# Patient Record
Sex: Female | Born: 2000 | Race: Black or African American | Hispanic: No | Marital: Single | Smoking: Never smoker
Health system: Southern US, Community
[De-identification: ages and names within clinical notes are randomized; demographics above are authoritative.]

---

## 2020-07-07 ENCOUNTER — Other Ambulatory Visit: Payer: Self-pay | Admitting: Nurse Practitioner

## 2020-07-07 DIAGNOSIS — N63 Unspecified lump in unspecified breast: Secondary | ICD-10-CM

## 2020-08-06 ENCOUNTER — Ambulatory Visit
Admission: RE | Admit: 2020-08-06 | Discharge: 2020-08-06 | Disposition: A | Discharge status: Home / Self Care | Payer: PRIVATE HEALTH INSURANCE | Source: Ambulatory Visit | Attending: Nurse Practitioner | Admitting: Nurse Practitioner

## 2020-08-06 ENCOUNTER — Other Ambulatory Visit: Payer: Self-pay

## 2020-08-06 ENCOUNTER — Other Ambulatory Visit: Payer: Self-pay | Admitting: Nurse Practitioner

## 2020-08-06 DIAGNOSIS — N63 Unspecified lump in unspecified breast: Secondary | ICD-10-CM

## 2020-08-11 ENCOUNTER — Other Ambulatory Visit: Payer: Self-pay

## 2020-08-20 ENCOUNTER — Other Ambulatory Visit: Payer: Self-pay

## 2021-02-17 ENCOUNTER — Other Ambulatory Visit: Payer: Self-pay | Admitting: Surgery

## 2021-02-18 ENCOUNTER — Other Ambulatory Visit: Payer: PRIVATE HEALTH INSURANCE

## 2021-03-11 ENCOUNTER — Other Ambulatory Visit: Payer: Self-pay

## 2021-03-11 ENCOUNTER — Ambulatory Visit
Admission: RE | Admit: 2021-03-11 | Discharge: 2021-03-11 | Disposition: A | Discharge status: Home / Self Care | Payer: No Typology Code available for payment source | Source: Ambulatory Visit | Attending: Nurse Practitioner | Admitting: Nurse Practitioner

## 2021-03-11 ENCOUNTER — Other Ambulatory Visit: Payer: Self-pay | Admitting: Nurse Practitioner

## 2021-03-11 DIAGNOSIS — N63 Unspecified lump in unspecified breast: Secondary | ICD-10-CM

## 2021-03-12 ENCOUNTER — Other Ambulatory Visit: Payer: Self-pay

## 2021-03-12 ENCOUNTER — Emergency Department (HOSPITAL_COMMUNITY)
Admission: EM | Admit: 2021-03-12 | Discharge: 2021-03-12 | Disposition: A | Discharge status: Home / Self Care | Payer: No Typology Code available for payment source | Attending: Emergency Medicine | Admitting: Emergency Medicine

## 2021-03-12 ENCOUNTER — Encounter (HOSPITAL_COMMUNITY): Payer: Self-pay | Admitting: Emergency Medicine

## 2021-03-12 DIAGNOSIS — R55 Syncope and collapse: Secondary | ICD-10-CM | POA: Diagnosis present

## 2021-03-12 DIAGNOSIS — R519 Headache, unspecified: Secondary | ICD-10-CM | POA: Diagnosis not present

## 2021-03-12 DIAGNOSIS — W01198A Fall on same level from slipping, tripping and stumbling with subsequent striking against other object, initial encounter: Secondary | ICD-10-CM | POA: Insufficient documentation

## 2021-03-12 NOTE — ED Provider Notes (Signed)
[facility]-EMERGENCY DEPT Provider Note   CSN: [id number] Arrival date & time: 03/12/21  1900     History Chief Complaint  Patient presents with   Lisa Buck is a 20 y.o. female.  20 year old otherwise healthy female presents with report of syncopal episode which occurred yesterday.  Patient states that she was at a family gathering, was wearing multiple layers and began to feel very hot.  Patient stood up to go outside and at that time passed out and hit the floor.  Reports brief loss of consciousness, awoke to family standing around her.  States that she has a mild headache today.  She denies nausea, vomiting, visual disturbance.  No history of prior syncopal episodes.  No loss of bowel or bladder control with her episode.  Patient is not anticoagulated.  No other complaints or concerns.     OB History   No obstetric history on file.     No family history on file.  Social History   Tobacco Use   Smoking status: Never   Smokeless tobacco: Never  Substance Use Topics   Alcohol use: Yes   Drug use: Not Currently    Home Medications Prior to Admission medications   Not on File    Allergies    Patient has no known allergies.  Review of Systems   Review of Systems  Constitutional:  Negative for chills and fever.  Eyes:  Negative for visual disturbance.  Gastrointestinal:  Negative for nausea and vomiting.  Musculoskeletal:  Negative for arthralgias, back pain, gait problem, myalgias, neck pain and neck stiffness.  Skin:  Negative for rash and wound.  Allergic/Immunologic: Negative for immunocompromised state.  Neurological:  Positive for syncope and headaches. Negative for seizures.  Hematological:  Does not bruise/bleed easily.  Psychiatric/Behavioral:  Negative for confusion.   All other systems reviewed and are negative.  Physical Exam Updated Vital Signs BP (!) 141/83   Pulse 66   Temp 98.7 F (37.1 C) (Oral)   Resp 20    Ht 5\' 1"  (1.549 m)   Wt 58.1 kg   LMP 02/20/2021   SpO2 98%   BMI 24.19 kg/m   Physical Exam Vitals and nursing note reviewed.  Constitutional:      General: She is not in acute distress.    Appearance: She is well-developed. She is not diaphoretic.  HENT:     Head: Normocephalic and atraumatic.     Right Ear: Tympanic membrane and ear canal normal.     Left Ear: Tympanic membrane and ear canal normal.     Nose: Nose normal.     Mouth/Throat:     Mouth: Mucous membranes are moist.  Eyes:     Extraocular Movements: Extraocular movements intact.     Pupils: Pupils are equal, round, and reactive to light.  Cardiovascular:     Rate and Rhythm: Normal rate and regular rhythm.     Heart sounds: Normal heart sounds.  Pulmonary:     Effort: Pulmonary effort is normal.     Breath sounds: Normal breath sounds.  Musculoskeletal:     Cervical back: Normal range of motion and neck supple. No tenderness or bony tenderness.     Thoracic back: No tenderness or bony tenderness.     Lumbar back: No tenderness or bony tenderness.     Right lower leg: No edema.     Left lower leg: No edema.  Skin:    General: Skin  is warm and dry.     Findings: No erythema or rash.  Neurological:     General: No focal deficit present.     Mental Status: She is alert and oriented to person, place, and time.     Cranial Nerves: No cranial nerve deficit.     Sensory: No sensory deficit.     Motor: No weakness.     Coordination: Coordination normal.  Psychiatric:        Behavior: Behavior normal.    ED Results / Procedures / Treatments   Labs (all labs ordered are listed, but only abnormal results are displayed) Labs Reviewed - No data to display  EKG None  Radiology US BREAST LTD UNI RIGHT INC AXILLA  Result Date: 03/11/2021 CLINICAL DATA:  21 year old female presenting for first six-month follow-up of a probably benign right breast mass. EXAM: ULTRASOUND OF THE RIGHT BREAST COMPARISON:   Previous exam(s). FINDINGS: Targeted ultrasound is performed, showing stable appearance of a circumscribed hypoechoic mass at the 7:30 position 5 cm from the nipple. It measures 1.1 x 0.4 x 0.8 cm (previously 1.2 x 0.5 x 0.8 cm). IMPRESSION: Stable, probably benign right breast mass. Recommend continued short-term follow-up. RECOMMENDATION: Right breast ultrasound in 6 months. I have discussed the findings and recommendations with the patient. If applicable, a reminder letter will be sent to the patient regarding the next appointment. BI-RADS CATEGORY  3: Probably benign. Electronically Signed   By: Kristopher Oppenheim M.D.   On: 03/11/2021 10:55   Procedures Procedures   Medications Ordered in ED Medications - No data to display  ED Course  I have reviewed the triage vital signs and the nursing notes.  Pertinent labs & imaging results that were available during my care of the patient were reviewed by me and considered in my medical decision making (see chart for details).  Clinical Course as of 03/12/21 2154  Sat Mar 13, 5419  1651 20 year old otherwise healthy female with report of syncopal episode which occurred yesterday.  Patient reports brain multiple layers of clothing, feeling overheated and when she stood up to leave the room to get air she passed out. Reports mild headache at this time otherwise no history of syncope otherwise, not anticoagulated, no vomiting.  No other injuries, complaints, concerns. Exam is unremarkable, there is no head tenderness, no midline neck or back tenderness, neuro exam normal, gait normal. Suspect syncopal episode likely due to overheating.  Patient is concerned she may have a concussion, she is referred to concussion clinic for follow-up, advised return to the emergency room for any worsening or concerning symptoms. [LM]    Clinical Course User Index [LM] Lisa Buck   MDM Rules/Calculators/A&P                           Final Clinical  Impression(s) / ED Diagnoses Final diagnoses:  Syncope, unspecified syncope type    Rx / DC Orders ED Discharge Orders     None        Lisa Buck 03/12/21 2154    Varney Biles, MD 03/13/21 1349

## 2021-03-12 NOTE — ED Triage Notes (Signed)
Patient arrives stating that she was at a "house gathering" last night playing board games when she got over heated due to how she was dressed. When patient stood up, she states she possibly fainted. Patient unsure if she hit her head, but does endorse a headache. Patient endorses some alcohol use, no drug use during the event.

## 2021-03-12 NOTE — Discharge Instructions (Signed)
You can take Motrin and Tylenol as needed as directed for headaches.  Follow-up with the concussion clinic if needed, call Monday to schedule an appointment.  Return to the emergency room for worsening or concerning symptoms.

## 2021-06-10 ENCOUNTER — Other Ambulatory Visit: Payer: Self-pay

## 2021-06-10 ENCOUNTER — Emergency Department (HOSPITAL_COMMUNITY)
Admission: EM | Admit: 2021-06-10 | Discharge: 2021-06-11 | Disposition: A | Discharge status: Home / Self Care | Payer: No Typology Code available for payment source | Attending: Emergency Medicine | Admitting: Emergency Medicine

## 2021-06-10 ENCOUNTER — Encounter (HOSPITAL_COMMUNITY): Payer: Self-pay | Admitting: Emergency Medicine

## 2021-06-10 DIAGNOSIS — N898 Other specified noninflammatory disorders of vagina: Secondary | ICD-10-CM | POA: Insufficient documentation

## 2021-06-10 DIAGNOSIS — R102 Pelvic and perineal pain: Secondary | ICD-10-CM | POA: Insufficient documentation

## 2021-06-10 DIAGNOSIS — B081 Molluscum contagiosum: Secondary | ICD-10-CM | POA: Insufficient documentation

## 2021-06-10 DIAGNOSIS — R21 Rash and other nonspecific skin eruption: Secondary | ICD-10-CM | POA: Diagnosis present

## 2021-06-10 LAB — URINALYSIS, MICROSCOPIC (REFLEX)

## 2021-06-10 LAB — URINALYSIS, ROUTINE W REFLEX MICROSCOPIC
Bilirubin Urine: NEGATIVE
Glucose, UA: NEGATIVE mg/dL
Hgb urine dipstick: NEGATIVE
Ketones, ur: 15 mg/dL — AB
Nitrite: NEGATIVE
Protein, ur: NEGATIVE mg/dL
Specific Gravity, Urine: 1.02 (ref 1.005–1.030)
pH: 6 (ref 5.0–8.0)

## 2021-06-10 NOTE — ED Provider Triage Note (Signed)
Emergency Medicine Provider Triage Evaluation Note  Lisa Buck , a 21 y.o. female  was evaluated in triage.  Pt complains of vaginal pain/discharge x2 weeks.  She was evaluated [facility] her [facility] at the start of her symptoms and diagnosed with BV, she was treated with Flagyl gel.  She was evaluated at student [facility] center again on 06/07/2021 for similar symptoms and at that time tested positive for BV again and was treated with Flagyl p.o. prescription.  She has associated rash to the vagina that is painful to the touch.  She is sexually active with intermittent protected intercourse.  Has not tried any medication for symptoms.  Denies fever, chills, abdominal pain, nausea, vomiting.  Review of Systems  Positive: As per HPI above Negative: As per HPI above  Physical Exam  BP 137/76    Pulse 88    Temp 98.5 F (36.9 C) (Oral)    Resp 16    Ht 5\' 1"  (1.549 m)    Wt 64 kg    SpO2 98%    BMI 26.66 kg/m  Gen:   Awake, no distress   Resp:  Normal effort  MSK:   Moves extremities without difficulty  Other:  No abdominal tenderness to palpation.  GU exam deferred in triage.  Medical Decision Making  Medically screening exam initiated at 10:17 PM.  Appropriate orders placed.  ADELAIDE PFEFFERKORN was informed that the remainder of the evaluation will be completed by another provider, this initial triage assessment does not replace that evaluation, and the importance of remaining in the ED until their evaluation is complete.    Timotheus Salm A, PA-C 06/10/21 2217

## 2021-06-10 NOTE — ED Triage Notes (Signed)
Patient reports vaginal pain with discharge and rashes onset 2 weeks ago , denies dysuria or hematuria .

## 2021-06-11 LAB — WET PREP, GENITAL
Sperm: NONE SEEN
Trich, Wet Prep: NONE SEEN
WBC, Wet Prep HPF POC: >=10 — AB (ref <10)
Yeast Wet Prep HPF POC: NONE SEEN

## 2021-06-11 LAB — HIV ANTIBODY (ROUTINE TESTING W REFLEX): HIV Screen 4th Generation wRfx: NONREACTIVE

## 2021-06-11 LAB — RPR: RPR Ser Ql: NONREACTIVE

## 2021-06-11 NOTE — Discharge Instructions (Addendum)
Your symptoms are consistent with molluscum which is considered a sexually transmitted infection in adulthood. This is self limiting and often results without treatment, though it should be monitored by a primary care doctor or OBGYN. Use a condom when sexually active. Your additional STD test results should be availabel in MyChart in 48 hours. Finish out your course of Flagyl as previously prescribed.

## 2021-06-11 NOTE — ED Provider Notes (Signed)
Lisa Buck Provider Note   CSN: [id number] Arrival date & time: 06/10/21  2140     History  Chief Complaint  Patient presents with   Vaginal Pain /Discharge    Lisa Buck is a 21 y.o. female.  21 year old female presents to the emergency Buck for evaluation of rash to her external genitalia.  States that she has had symptoms for approximately 1 to 2 weeks.  She was previously seen and evaluated at her student health center and treated for bacterial vaginosis.  Was given a prescription for MetroGel, subsequently treated after a second visit with oral Flagyl.  Reports that her symptoms continue to persist despite this medication.  She has had no vaginal bleeding, new/worsening vaginal discharge, fever, chills, abdominal pain, nausea, vomiting, urinary symptoms.  She reports sexual activity with 1 female partner with inconsistent use of condoms.  The history is provided by the patient. No language interpreter was used.      Home Medications Prior to Admission medications   Not on File      Allergies    Patient has no known allergies.    Review of Systems   Review of Systems Ten systems reviewed and are negative for acute change, except as noted in the HPI.    Physical Exam Updated Vital Signs BP 130/70    Pulse 74    Temp 98.6 F (37 C) (Oral)    Resp 16    Ht 5\' 1"  (1.549 m)    Wt 64 kg    SpO2 98%    BMI 26.66 kg/m   Physical Exam Vitals and nursing note reviewed.  Constitutional:      General: She is not in acute distress.    Appearance: She is well-developed. She is not diaphoretic.     Comments: Nontoxic appearing and in NAD  HENT:     Head: Normocephalic and atraumatic.  Eyes:     General: No scleral icterus.    Conjunctiva/sclera: Conjunctivae normal.  Pulmonary:     Effort: Pulmonary effort is normal. No respiratory distress.     Comments: Respirations even and unlabored Genitourinary:    Comments: Scattered  punctate papules to the vulva, specifically the labia majora. No vesicles, ulcerations. Scant thick, white mucousy discharge in the vaginal vault. No bleeding. No CMT or adnexal TTP. Musculoskeletal:        General: Normal range of motion.     Cervical back: Normal range of motion.  Skin:    General: Skin is warm and dry.     Coloration: Skin is not pale.     Findings: No erythema or rash.  Neurological:     Mental Status: She is alert and oriented to person, place, and time.     Coordination: Coordination normal.  Psychiatric:        Behavior: Behavior normal.    ED Results / Procedures / Treatments   Labs (all labs ordered are listed, but only abnormal results are displayed) Labs Reviewed  WET PREP, GENITAL - Abnormal; Notable for the following components:      Result Value   Clue Cells Wet Prep HPF POC PRESENT (*)    WBC, Wet Prep HPF POC >=10 (*)    All other components within normal limits  URINALYSIS, ROUTINE W REFLEX MICROSCOPIC - Abnormal; Notable for the following components:   Ketones, ur 15 (*)    Leukocytes,Ua TRACE (*)    All other components within normal limits  URINALYSIS,  MICROSCOPIC (REFLEX) - Abnormal; Notable for the following components:   Bacteria, UA RARE (*)    All other components within normal limits  HIV ANTIBODY (ROUTINE TESTING W REFLEX)  RPR  HSV 2 ANTIBODY, IGG  HSV 1 ANTIBODY, IGG  GC/CHLAMYDIA PROBE AMP ([place]) NOT AT [facility]    EKG None  Radiology No results found.  Procedures Procedures    Medications Ordered in ED Medications - No data to display  ED Course/ Medical Decision Making/ A&P                           Medical Decision Making Amount and/or Complexity of Data Reviewed Labs: ordered.   This patient presents to the ED for concern of vaginal rash/irritation, this involves Buck extensive number of treatment options, and is a complaint that carries with it a high risk of complications and morbidity.  The differential  diagnosis includes vaginitis vs STI vs genital abscess vs genital trauma.   Co morbidities that complicate the patient evaluation  None    Lab Tests:  I Ordered, and personally interpreted labs.  The pertinent results include:  negative UA; wet prep with clue cells; negative HIV screening. GC/Chlamydia and HSV testing pending   Cardiac Monitoring:  The patient was maintained on a cardiac monitor.  I personally viewed and interpreted the cardiac monitored which showed Buck underlying rhythm of: NSR   Test Considered:  Genital skin viral culture   Reevaluation:  After the interventions noted above, I reevaluated the patient and found that they have :stayed the same   Social Determinants of Health:  Insured  Educated; Archivist   Dispostion:  After consideration of the diagnostic results and the patients response to treatment, I feel that the patent would benefit from outpatient OBGYN follow up.  Physical exam findings consistent with molluscum contagiosum.  Lesions not consistent with genital herpes, syphilis.  No evidence of external genital trauma or vaginal injury.  While patient does have clue cells on her wet prep, she denies any new or worsening vaginal discharge.  Clinically do not feel that this warrants any treatment escalation given that she is currently taking oral Flagyl.  Discussed self-limiting nature of molluscum as well as return precautions.  Patient discharged in stable condition with no unaddressed concerns.         Final Clinical Impression(s) / ED Diagnoses Final diagnoses:  Molluscum contagiosum    Rx / DC Orders ED Discharge Orders     None         Antony Madura, PA-C 07/08/21 0509    Dione Booze, MD 07/17/21 2236

## 2021-06-11 NOTE — ED Notes (Signed)
Spoke with lab, states they should have enough blood for send out hsv antibody

## 2021-06-12 LAB — HSV 2 ANTIBODY, IGG: HSV 2 Glycoprotein G Ab, IgG: <0.91 index (ref 0.00–0.90)

## 2021-06-12 LAB — HSV 1 ANTIBODY, IGG: HSV 1 Glycoprotein G Ab, IgG: <0.91 index (ref 0.00–0.90)

## 2021-06-13 LAB — GC/CHLAMYDIA PROBE AMP (~~LOC~~) NOT AT ARMC
Chlamydia: NEGATIVE
Comment: NEGATIVE
Comment: NORMAL
Neisseria Gonorrhea: NEGATIVE

## 2021-09-09 ENCOUNTER — Inpatient Hospital Stay: Admission: RE | Admit: 2021-09-09 | Payer: No Typology Code available for payment source | Source: Ambulatory Visit

## 2023-06-01 IMAGING — US US BREAST*R* LIMITED INC AXILLA
1 series · 4 of 4 positions shown · non-contrast
Comparison: Previous exam(s).

CLINICAL DATA: 20-year-old female presenting for first six-month
follow-up of a probably benign right breast mass.

EXAM:
ULTRASOUND OF THE RIGHT BREAST

[Series 1: us breast*right* limited inc axilla · 0.05mm/px · 4 of 4 slices shown]
[im 1/4]
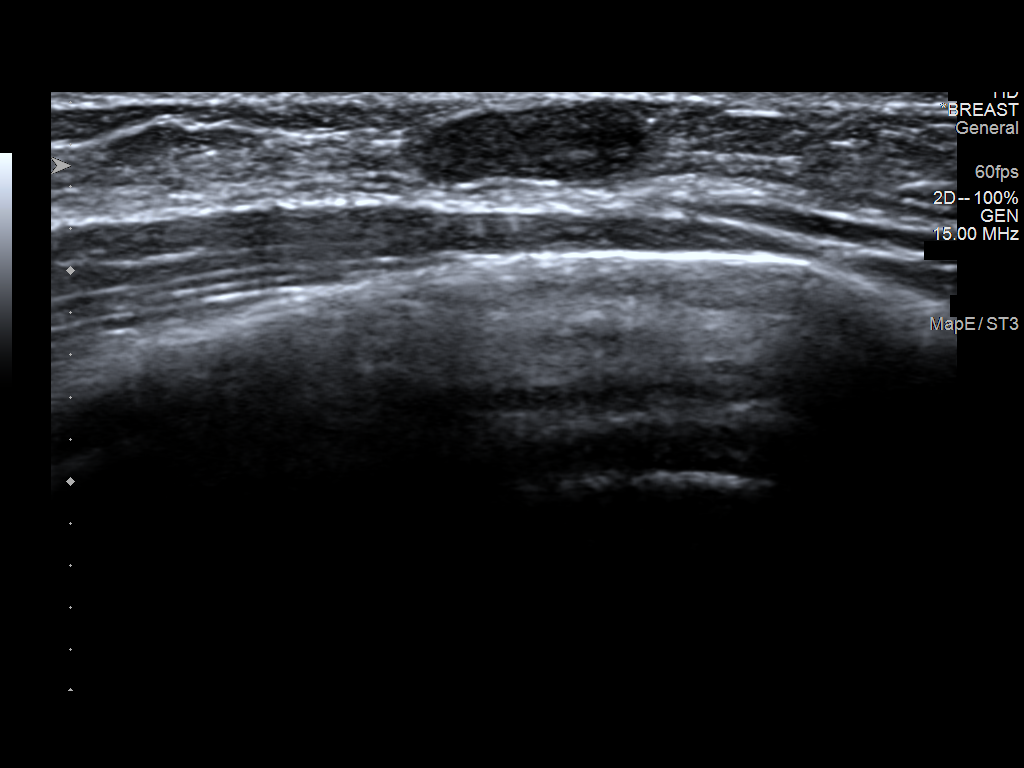
[im 2/4]
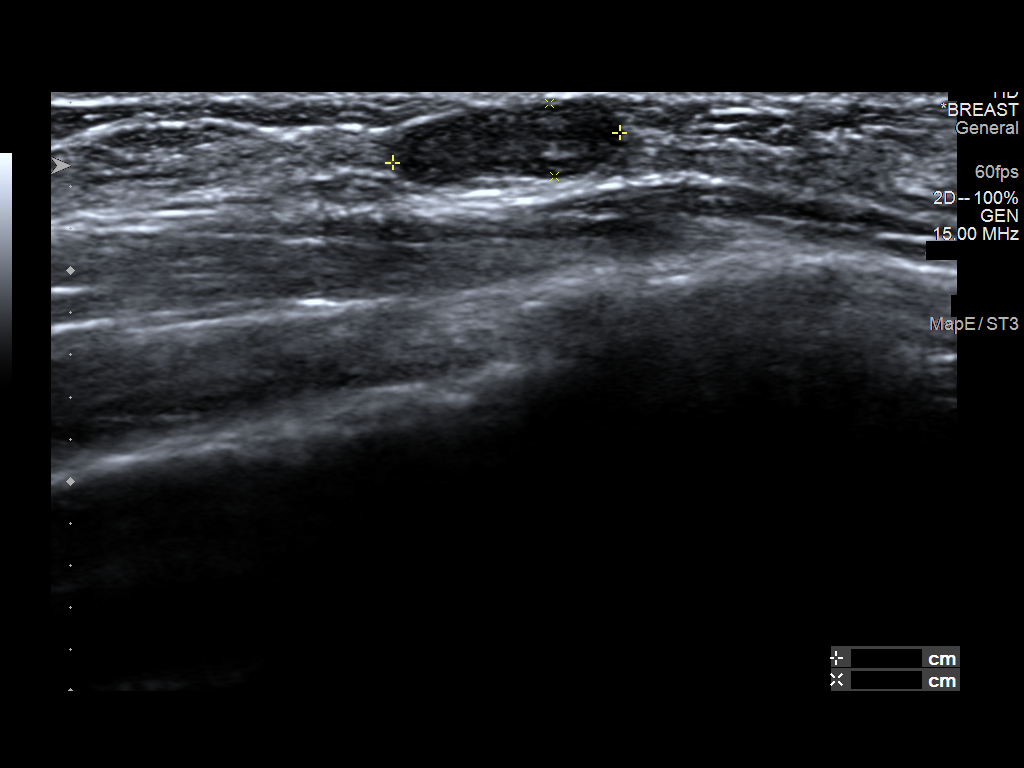
[im 3/4]
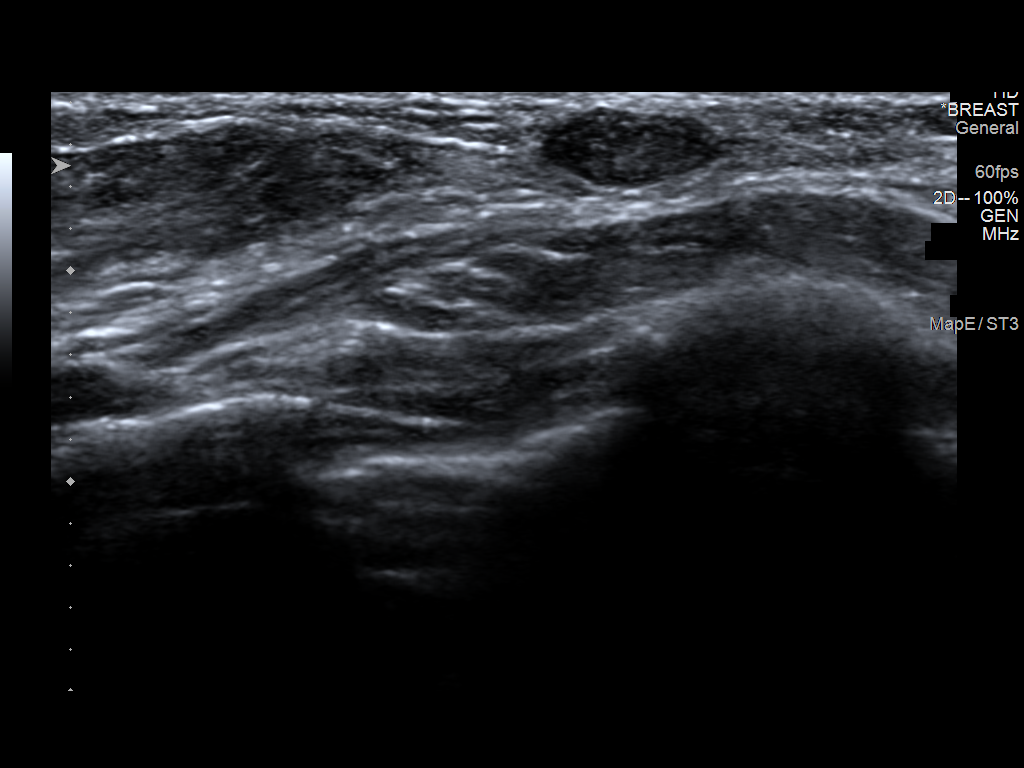
[im 4/4]
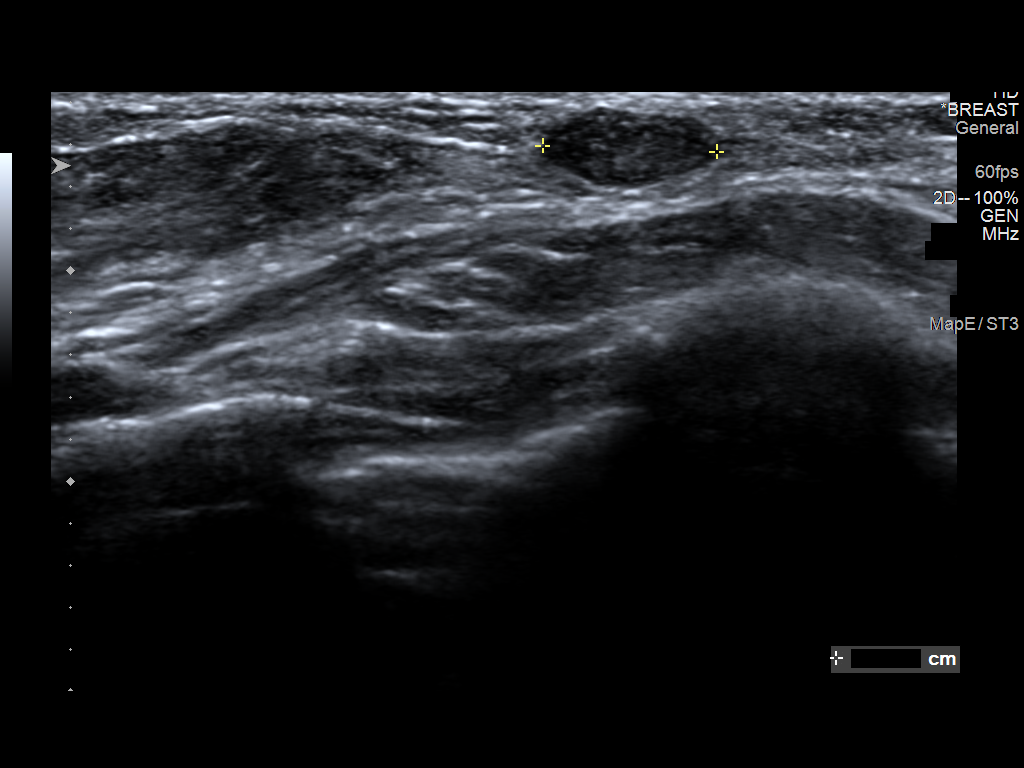

[4 of 4 positions shown; findings below may reference images not displayed]

FINDINGS: Targeted ultrasound is performed, showing stable appearance of a
circumscribed hypoechoic mass at the [DATE] position 5 cm from the
nipple. It measures 1.1 x 0.4 x 0.8 cm (previously 1.2 x 0.5 x
cm).
IMPRESSION: Stable, probably benign right breast mass. Recommend continued
short-term follow-up.

RECOMMENDATION:
Right breast ultrasound in 6 months.

I have discussed the findings and recommendations with the patient.
If applicable, a reminder letter will be sent to the patient
regarding the next appointment.

BI-RADS CATEGORY  3: Probably benign.
